# Patient Record
Sex: Male | Born: 1995 | Race: Black or African American | Hispanic: No | Marital: Single | Smoking: Never smoker
Health system: Southern US, Community
[De-identification: ages and names within clinical notes are randomized; demographics above are authoritative.]

## PROBLEM LIST (undated history)

## (undated) DIAGNOSIS — M94261 Chondromalacia, right knee: Secondary | ICD-10-CM

## (undated) DIAGNOSIS — Z87898 Personal history of other specified conditions: Secondary | ICD-10-CM

## (undated) DIAGNOSIS — M2340 Loose body in knee, unspecified knee: Secondary | ICD-10-CM

---
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
## 2016-11-12 DIAGNOSIS — J069 Acute upper respiratory infection, unspecified: Secondary | ICD-10-CM | POA: Diagnosis not present

## 2016-11-14 ENCOUNTER — Ambulatory Visit (HOSPITAL_COMMUNITY)
Admission: EM | Admit: 2016-11-14 | Discharge: 2016-11-14 | Disposition: A | Discharge status: Home / Self Care | Payer: Commercial Managed Care - HMO | Attending: Family Medicine | Admitting: Family Medicine

## 2016-11-14 ENCOUNTER — Encounter (HOSPITAL_COMMUNITY): Payer: Self-pay | Admitting: Emergency Medicine

## 2016-11-14 DIAGNOSIS — J069 Acute upper respiratory infection, unspecified: Secondary | ICD-10-CM

## 2016-11-14 DIAGNOSIS — B9789 Other viral agents as the cause of diseases classified elsewhere: Secondary | ICD-10-CM

## 2016-11-14 MED ORDER — HYDROCODONE-HOMATROPINE 5-1.5 MG/5ML PO SYRP: 5.0000 mL | ORAL_SOLUTION | Freq: Four times a day (QID) | ORAL | 0 refills | Status: DC | PRN

## 2016-11-14 NOTE — ED Provider Notes (Addendum)
[facility]    CSN: [id number] Arrival date & time: 11/14/16  1650     History   Chief Complaint Chief Complaint  Patient presents with  . Cough    HPI Bryan Roach is a 21 y.o. male presenting for cough. He's been recovering from a recent URI diagnosed as a sinus infection by the student health clinic about 1 week ago. He presented there with a 5 day history of yellow sinus drainage, headache/sinus pressure and fever. These symptoms have subsided but he's developed a severe, frequent intermittent nonproductive cough worsening over the past week. He has not slept due to cough, despite taking several OTC cough syrup/medications. He has trouble breathing during coughing spells but not otherwise. He denies a history of asthma. He had pneumonia last year and states this is nothing like that. He's not measured a fever at home. Nonsmoker, denies EtOH or other drugs, student at [employer].   HPI  Past Medical History:  Diagnosis Date  . 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 History of febrile seizure    x 2-3  . Loose body in knee 08/2014   right    Patient Active Problem List   Diagnosis Date Noted  . Seasonal allergies   . ACL tear   . Lateral meniscus tear 07/03/2013  . Sports physical 04/17/2013    Past Surgical History:  Procedure Laterality Date  . CHONDROPLASTY  09/01/2014   Procedure: CHONDROPLASTY;  Surgeon: Nilda Simmer, MD;  Location: [facility];  Service: Orthopedics;;  . KNEE ARTHROSCOPY WITH ANTERIOR CRUCIATE LIGAMENT (ACL) REPAIR WITH HAMSTRING GRAFT Right 07/21/2013   Procedure: KNEE ARTHROSCOPY WITH ANTERIOR CRUCIATE LIGAMENT (ACL) REPAIR WITH HAMSTRING GRAFT;  Surgeon: Nilda Simmer, MD;  Location: [facility];  Service: Orthopedics;  Laterality: Right;  . KNEE ARTHROSCOPY WITH LATERAL MENISECTOMY Right 07/21/2013   Procedure: RIGHT KNEE ARTHROSCOPY WITH LATERAL MENISECTOMY VS. LATERAL MENISCUS REPAIR;   Surgeon: Nilda Simmer, MD;  Location: [facility];  Service: Orthopedics;  Laterality: Right;  . KNEE ARTHROSCOPY WITH LATERAL MENISECTOMY  09/01/2014   Procedure: KNEE ARTHROSCOPY WITH LATERAL MENISECTOMY;  Surgeon: Nilda Simmer, MD;  Location: [facility];  Service: Orthopedics;;  . KNEE ARTHROSCOPY WITH MEDIAL MENISECTOMY Right 09/01/2014   Procedure: RIGHT KNEE SCOPE WITH LOOSE BODY REMOVAL, PARTIAL MEDIAL AND LATERAL MENISECTOMIES, CHONDROPLASTY ;  Surgeon: Nilda Simmer, MD;  Location: [facility];  Service: Orthopedics;  Laterality: Right;       Home Medications    Prior to Admission medications   Medication Sig Start Date End Date Taking? Authorizing Provider  HYDROcodone-homatropine (HYCODAN) 5-1.5 MG/5ML syrup Take 5 mLs by mouth every 6 (six) hours as needed for cough. 11/14/16   Tyrone Nine, MD    Family History Family History  Problem Relation Age of Onset  . Hypertension Maternal Grandmother   . Hypertension Mother   . Hypertension Maternal Aunt   . Sudden death Neg Hx   . Heart attack Neg Hx     Social History Social History  Substance Use Topics  . Smoking status: Never Smoker  . Smokeless tobacco: Never Used  . Alcohol use No     Allergies   Apple; Banana; Peach flavor; and Penicillins   Review of Systems Review of Systems Per HPI and no myalgias, arthralgias or chest pain.  Physical Exam Physical Exam Blood pressure 121/82, pulse 88, temperature 99 F (37.2 C), temperature source  Oral, resp. rate 18, SpO2 96 %. GEN: Athletic adolescent male in no distress HEENT: MMM, conjunctivae normal, TMs normal bilaterally, PND noticed in otherwise clear oropharynx, nares patent with normal turbinates.   NECK: supple, no lymphadenopathy. CHEST: normal air exchange with normal respiratory effort and no retractions; no rales, no rhonchi, no wheezes HEART: regular rate, normal S1/S2, no murmurs  UC Treatments /  Results  Procedures (including critical care time)  Initial Impression / Assessment and Plan / UC Course  I have reviewed the triage vital signs and the nursing notes.  Pertinent labs & imaging results that were available during my care of the patient were reviewed by me and considered in my medical decision making (see chart for details).    21 y.o. male with post URI cough without red flags. No indication for CXR or labs at this time. Will treat symptomatically with prescription for cough syrup in addition to honey, hydration. Return if not improving in next week.   Final Clinical Impressions(s) / UC Diagnoses   Final diagnoses:  Viral URI with cough    New Prescriptions New Prescriptions   HYDROCODONE-HOMATROPINE (HYCODAN) 5-1.5 MG/5ML SYRUP    Take 5 mLs by mouth every 6 (six) hours as needed for cough.     Tyrone Nineyan B Lakeyia Surber, MD 11/14/16 1815    Tyrone Nineyan B Eliza Grissinger, MD 11/14/16 [phone]

## 2016-11-14 NOTE — Discharge Instructions (Signed)
You're experiencing a cough that is the result of the upper respiratory infection you had last week. This will continue to improve over time. Take the cough syrup at night as needed. Take a tablespoon of honey as needed and keep lozenges on hand to help with cough during the day. If you develop fever, productive worsening cough please return for care.

## 2016-11-14 NOTE — ED Triage Notes (Signed)
See s/s.  Patient reports cough is worse over the last few days.  Cough is disrupting sleep.

## 2017-03-13 ENCOUNTER — Encounter (HOSPITAL_BASED_OUTPATIENT_CLINIC_OR_DEPARTMENT_OTHER): Payer: Self-pay | Admitting: *Deleted

## 2017-03-13 ENCOUNTER — Emergency Department (HOSPITAL_BASED_OUTPATIENT_CLINIC_OR_DEPARTMENT_OTHER)
Admission: EM | Admit: 2017-03-13 | Discharge: 2017-03-13 | Disposition: A | Discharge status: Home / Self Care | Payer: 59 | Attending: Emergency Medicine | Admitting: Emergency Medicine

## 2017-03-13 DIAGNOSIS — M542 Cervicalgia: Secondary | ICD-10-CM | POA: Diagnosis not present

## 2017-03-13 NOTE — Discharge Instructions (Signed)
Please read instructions below. You can take advil every 6 hours as needed for pain. Talk with your PCP about any new medications. Follow up as needed if symptoms persist. Return to ER if new numbness or tingling in your arms or legs, inability to urinate, inability to hold your bowels, or weakness in your extremities, severe headache, changes in your vision, or uncontrollable vomiting.
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
## 2017-03-13 NOTE — ED Notes (Signed)
Pt verbalizes understanding of d/c instructions and denies any further needs at this time. 
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
## 2017-04-01 ENCOUNTER — Encounter (HOSPITAL_BASED_OUTPATIENT_CLINIC_OR_DEPARTMENT_OTHER): Payer: Self-pay | Admitting: *Deleted

## 2017-04-01 ENCOUNTER — Emergency Department (HOSPITAL_BASED_OUTPATIENT_CLINIC_OR_DEPARTMENT_OTHER)
Admission: EM | Admit: 2017-04-01 | Discharge: 2017-04-01 | Disposition: A | Discharge status: Home / Self Care | Payer: 59 | Attending: Emergency Medicine | Admitting: Emergency Medicine

## 2017-04-01 DIAGNOSIS — G44319 Acute post-traumatic headache, not intractable: Secondary | ICD-10-CM | POA: Diagnosis not present

## 2017-04-01 DIAGNOSIS — Y939 Activity, unspecified: Secondary | ICD-10-CM | POA: Insufficient documentation

## 2017-04-01 DIAGNOSIS — S0990XA Unspecified injury of head, initial encounter: Secondary | ICD-10-CM | POA: Diagnosis not present

## 2017-04-01 DIAGNOSIS — Y999 Unspecified external cause status: Secondary | ICD-10-CM | POA: Diagnosis not present

## 2017-04-01 DIAGNOSIS — G44309 Post-traumatic headache, unspecified, not intractable: Secondary | ICD-10-CM

## 2017-04-01 DIAGNOSIS — Y929 Unspecified place or not applicable: Secondary | ICD-10-CM | POA: Diagnosis not present
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
## 2017-09-24 DIAGNOSIS — M25512 Pain in left shoulder: Secondary | ICD-10-CM | POA: Diagnosis not present

## 2017-09-25 ENCOUNTER — Other Ambulatory Visit: Payer: Self-pay | Admitting: Orthopedic Surgery

## 2017-09-25 DIAGNOSIS — M25512 Pain in left shoulder: Secondary | ICD-10-CM

## 2017-10-10 ENCOUNTER — Inpatient Hospital Stay
Admission: RE | Admit: 2017-10-10 | Discharge: 2017-10-10 | Disposition: A | Discharge status: Home / Self Care | Payer: Commercial Managed Care - HMO | Source: Ambulatory Visit | Attending: Orthopedic Surgery | Admitting: Orthopedic Surgery

## 2018-12-08 ENCOUNTER — Ambulatory Visit (HOSPITAL_COMMUNITY)
Admission: EM | Admit: 2018-12-08 | Discharge: 2018-12-08 | Disposition: A | Discharge status: Home / Self Care | Payer: 59 | Attending: Family Medicine | Admitting: Family Medicine

## 2018-12-08 ENCOUNTER — Other Ambulatory Visit: Payer: Self-pay

## 2018-12-08 ENCOUNTER — Encounter (HOSPITAL_COMMUNITY): Payer: Self-pay

## 2018-12-08 DIAGNOSIS — Z113 Encounter for screening for infections with a predominantly sexual mode of transmission: Secondary | ICD-10-CM | POA: Insufficient documentation

## 2018-12-08 NOTE — ED Triage Notes (Signed)
Patient presents to Urgent Care with requests to be tested for STDs. Patient states he has no symptoms and was not notified to come get tested, just is here for reassurance.

## 2018-12-08 NOTE — ED Provider Notes (Signed)
[facility]    CSN: [id number] Arrival date & time: 12/08/18  1653     History   Chief Complaint Chief Complaint  Patient presents with  . Exposure to STD    HPI Bryan Roach is a 23 y.o. male.   23 year old male comes in for STD testing. No symptoms or exposures. States, "just want to be tested". He denies penile discharge, penile lesion/sores, testicular swelling/pain. Denies dysuria, hematuria, frequency. Denies abdominal pain, nausea, vomiting. Denies fever, chills, night sweats. Sexually active with one male partner, occasional condom use.      Past Medical History:  Diagnosis Date  . 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 History of febrile seizure    x 2-3  . Loose body in knee 08/2014   right    Patient Active Problem List   Diagnosis Date Noted  . Seasonal allergies   . ACL tear   . Lateral meniscus tear 07/03/2013  . Sports physical 04/17/2013    Past Surgical History:  Procedure Laterality Date  . CHONDROPLASTY  09/01/2014   Procedure: CHONDROPLASTY;  Surgeon: Nilda Simmer, MD;  Location: [facility];  Service: Orthopedics;;  . KNEE ARTHROSCOPY WITH ANTERIOR CRUCIATE LIGAMENT (ACL) REPAIR WITH HAMSTRING GRAFT Right 07/21/2013   Procedure: KNEE ARTHROSCOPY WITH ANTERIOR CRUCIATE LIGAMENT (ACL) REPAIR WITH HAMSTRING GRAFT;  Surgeon: Nilda Simmer, MD;  Location: [facility];  Service: Orthopedics;  Laterality: Right;  . KNEE ARTHROSCOPY WITH LATERAL MENISECTOMY Right 07/21/2013   Procedure: RIGHT KNEE ARTHROSCOPY WITH LATERAL MENISECTOMY VS. LATERAL MENISCUS REPAIR;  Surgeon: Nilda Simmer, MD;  Location: [facility];  Service: Orthopedics;  Laterality: Right;  . KNEE ARTHROSCOPY WITH LATERAL MENISECTOMY  09/01/2014   Procedure: KNEE ARTHROSCOPY WITH LATERAL MENISECTOMY;  Surgeon: Nilda Simmer, MD;  Location: [facility];  Service: Orthopedics;;  . KNEE ARTHROSCOPY  WITH MEDIAL MENISECTOMY Right 09/01/2014   Procedure: RIGHT KNEE SCOPE WITH LOOSE BODY REMOVAL, PARTIAL MEDIAL AND LATERAL MENISECTOMIES, CHONDROPLASTY ;  Surgeon: Nilda Simmer, MD;  Location: [facility];  Service: Orthopedics;  Laterality: Right;       Home Medications    Prior to Admission medications   Not on File    Family History Family History  Problem Relation Age of Onset  . Hypertension Maternal Grandmother   . Hypertension Mother   . Hypertension Maternal Aunt   . Sudden death Neg Hx   . Heart attack Neg Hx     Social History Social History   Tobacco Use  . Smoking status: Never Smoker  . Smokeless tobacco: Never Used  Substance Use Topics  . Alcohol use: No  . Drug use: No     Allergies   Apple; Banana; Peach flavor; and Penicillins   Review of Systems Review of Systems  Reason unable to perform ROS: See HPI as above.     Physical Exam Triage Vital Signs ED Triage Vitals  Enc Vitals Group     BP 12/08/18 1732 140/71     Pulse Rate 12/08/18 1732 88     Resp 12/08/18 1732 16     Temp 12/08/18 1732 98.4 F (36.9 C)     Temp Source 12/08/18 1732 Oral     SpO2 12/08/18 1732 99 %     Weight 12/08/18 1731 260 lb (117.9 kg)     Height 12/08/18 1731 5\' 11"  (1.803 m)     Head Circumference --  Peak Flow --      Pain Score 12/08/18 1731 0     Pain Loc --      Pain Edu? --      Excl. in GC? --    No data found.  Updated Vital Signs BP 140/71 (BP Location: Right Arm)   Pulse 88   Temp 98.4 F (36.9 C) (Oral)   Resp 16   Ht 5\' 11"  (1.803 m)   Wt 260 lb (117.9 kg)   SpO2 99%   BMI 36.26 kg/m   Physical Exam Constitutional:      General: He is not in acute distress.    Appearance: He is well-developed. He is not diaphoretic.  HENT:     Head: Normocephalic and atraumatic.  Eyes:     Conjunctiva/sclera: Conjunctivae normal.     Pupils: Pupils are equal, round, and reactive to light.  Pulmonary:     Effort:  Pulmonary effort is normal. No respiratory distress.  Neurological:     Mental Status: He is alert and oriented to person, place, and time.      UC Treatments / Results  Labs (all labs ordered are listed, but only abnormal results are displayed) Labs Reviewed - No data to display  EKG None  Radiology No results found.  Procedures Procedures (including critical care time)  Medications Ordered in UC Medications - No data to display  Initial Impression / Assessment and Plan / UC Course  I have reviewed the triage vital signs and the nursing notes.  Pertinent labs & imaging results that were available during my care of the patient were reviewed by me and considered in my medical decision making (see chart for details).    Cytology sent, patient will be contacted with any positive results that require additional treatment. Patient to refrain from sexual activity for the next 7 days. Return precautions given.   Final Clinical Impressions(s) / UC Diagnoses   Final diagnoses:  Screen for STD (sexually transmitted disease)    ED Prescriptions    None        Belinda Fisher, PA-C 12/08/18 1810

## 2018-12-08 NOTE — Discharge Instructions (Signed)
Cytology sent, you will be contacted with any positive results that requires further treatment. Refrain from sexual activity for the next 7 days. Monitor for testicular swelling/pain, penile sore/lesion, follow up for reevaluation needed.

## 2018-12-09 LAB — URINE CYTOLOGY ANCILLARY ONLY
Chlamydia: NEGATIVE
Neisseria Gonorrhea: NEGATIVE
Trichomonas: NEGATIVE

## 2019-10-29 ENCOUNTER — Other Ambulatory Visit: Payer: Self-pay

## 2019-10-29 ENCOUNTER — Emergency Department (HOSPITAL_COMMUNITY): Payer: 59

## 2019-10-29 ENCOUNTER — Encounter (HOSPITAL_COMMUNITY): Payer: Self-pay | Admitting: Emergency Medicine

## 2019-10-29 ENCOUNTER — Emergency Department (HOSPITAL_COMMUNITY)
Admission: EM | Admit: 2019-10-29 | Discharge: 2019-10-29 | Disposition: A | Discharge status: Home / Self Care | Payer: 59 | Attending: Emergency Medicine | Admitting: Emergency Medicine

## 2019-10-29 DIAGNOSIS — Z23 Encounter for immunization: Secondary | ICD-10-CM | POA: Diagnosis not present

## 2019-10-29 DIAGNOSIS — J90 Pleural effusion, not elsewhere classified: Secondary | ICD-10-CM | POA: Diagnosis not present

## 2019-10-29 DIAGNOSIS — Y92411 Interstate highway as the place of occurrence of the external cause: Secondary | ICD-10-CM | POA: Diagnosis not present

## 2019-10-29 DIAGNOSIS — Y999 Unspecified external cause status: Secondary | ICD-10-CM | POA: Insufficient documentation

## 2019-10-29 DIAGNOSIS — R52 Pain, unspecified: Secondary | ICD-10-CM

## 2019-10-29 DIAGNOSIS — Y93I9 Activity, other involving external motion: Secondary | ICD-10-CM | POA: Insufficient documentation

## 2019-10-29 DIAGNOSIS — S60511A Abrasion of right hand, initial encounter: Secondary | ICD-10-CM | POA: Diagnosis not present

## 2019-10-29 DIAGNOSIS — M25511 Pain in right shoulder: Secondary | ICD-10-CM | POA: Diagnosis present

## 2019-10-29 LAB — CBC
HCT: 47.7 % (ref 39.0–52.0)
Hemoglobin: 15.2 g/dL (ref 13.0–17.0)
MCH: 27.4 pg (ref 26.0–34.0)
MCHC: 31.9 g/dL (ref 30.0–36.0)
MCV: 85.9 fL (ref 80.0–100.0)
Platelets: 303 10*3/uL (ref 150–400)
RBC: 5.55 MIL/uL (ref 4.22–5.81)
RDW: 13.6 % (ref 11.5–15.5)
WBC: 11.8 10*3/uL — ABNORMAL HIGH (ref 4.0–10.5)
nRBC: 0 % (ref 0.0–0.2)

## 2019-10-29 LAB — COMPREHENSIVE METABOLIC PANEL
ALT: 24 U/L (ref 0–44)
AST: 26 U/L (ref 15–41)
Albumin: 4.2 g/dL (ref 3.5–5.0)
Alkaline Phosphatase: 66 U/L (ref 38–126)
Anion gap: 11 (ref 5–15)
BUN: 15 mg/dL (ref 6–20)
CO2: 25 mmol/L (ref 22–32)
Calcium: 9.6 mg/dL (ref 8.9–10.3)
Chloride: 103 mmol/L (ref 98–111)
Creatinine, Ser: 1.31 mg/dL — ABNORMAL HIGH (ref 0.61–1.24)
GFR calc Af Amer: >60 mL/min (ref >60)
GFR calc non Af Amer: >60 mL/min (ref >60)
Glucose, Bld: 100 mg/dL — ABNORMAL HIGH (ref 70–99)
Potassium: 4.7 mmol/L (ref 3.5–5.1)
Sodium: 139 mmol/L (ref 135–145)
Total Bilirubin: 0.8 mg/dL (ref 0.3–1.2)
Total Protein: 7.7 g/dL (ref 6.5–8.1)

## 2019-10-29 LAB — PROTIME-INR
INR: 0.9 (ref 0.8–1.2)
Prothrombin Time: 12.5 seconds (ref 11.4–15.2)

## 2019-10-29 LAB — I-STAT CHEM 8, ED
BUN: 16 mg/dL (ref 6–20)
Calcium, Ion: 1.21 mmol/L (ref 1.15–1.40)
Chloride: 102 mmol/L (ref 98–111)
Creatinine, Ser: 1.3 mg/dL — ABNORMAL HIGH (ref 0.61–1.24)
Glucose, Bld: 97 mg/dL (ref 70–99)
HCT: 49 % (ref 39.0–52.0)
Hemoglobin: 16.7 g/dL (ref 13.0–17.0)
Potassium: 4.7 mmol/L (ref 3.5–5.1)
Sodium: 139 mmol/L (ref 135–145)
TCO2: 28 mmol/L (ref 22–32)

## 2019-10-29 LAB — ETHANOL: Alcohol, Ethyl (B): <10 mg/dL (ref <10)

## 2019-10-29 LAB — SAMPLE TO BLOOD BANK

## 2019-10-29 LAB — LACTIC ACID, PLASMA: Lactic Acid, Venous: 2 mmol/L (ref 0.5–1.9)

## 2019-10-29 MED ORDER — IOHEXOL 300 MG/ML  SOLN
100.0000 mL | Freq: Once | INTRAMUSCULAR | Status: AC | PRN
  Administered 2019-10-29: 19:00:00 100 mL via INTRAVENOUS

## 2019-10-29 MED ORDER — TETANUS-DIPHTH-ACELL PERTUSSIS 5-2.5-18.5 LF-MCG/0.5 IM SUSP
0.5000 mL | Freq: Once | INTRAMUSCULAR | Status: AC
  Administered 2019-10-29: 17:00:00 0.5 mL via INTRAMUSCULAR
  Filled 2019-10-29: qty 0.5

## 2019-10-29 MED ORDER — NAPROXEN 375 MG PO TABS: 375.0000 mg | ORAL_TABLET | Freq: Two times a day (BID) | ORAL | 0 refills | Status: AC

## 2019-10-29 MED ORDER — CYCLOBENZAPRINE HCL 10 MG PO TABS: 10.0000 mg | ORAL_TABLET | Freq: Two times a day (BID) | ORAL | 0 refills | Status: AC | PRN
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
## 2019-10-29 NOTE — Discharge Instructions (Addendum)
We discussed the results of your CT.  I have prescribed a short course of anti-inflammatories along with muscle relaxers to help with your symptoms.  Please be aware you will most likely be very sore tomorrow, you may also apply heat or ice to the area to help with your symptoms.  You experience any chest pain, shortness of breath, worsening symptoms please return to emergency department.
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
## 2019-12-11 ENCOUNTER — Ambulatory Visit: Payer: 59 | Attending: Internal Medicine

## 2019-12-11 DIAGNOSIS — Z23 Encounter for immunization: Secondary | ICD-10-CM

## 2019-12-11 NOTE — Progress Notes (Signed)
   Covid-19 Vaccination Clinic  Name:  Bryan Roach    MRN: [id number] DOB: 06-May-1996  12/11/2019  Bryan Roach was observed post Covid-19 immunization for 15 minutes without incident. He was provided with Vaccine Information Sheet and instruction to access the V-Safe system.   Bryan Roach was instructed to call 911 with any severe reactions post vaccine: Marland Kitchen Difficulty breathing  . Swelling of face and throat  . A fast heartbeat  . A bad rash all over body  . Dizziness and weakness   Immunizations Administered    Name Date Dose VIS Date Route   Pfizer COVID-19 Vaccine 12/11/2019 11:14 AM 0.3 mL 08/28/2019 Intramuscular   Manufacturer: [employer], [employer]   Lot: [id number]   NDC: [address]-1

## 2020-01-06 ENCOUNTER — Ambulatory Visit: Payer: 59 | Attending: Internal Medicine

## 2020-01-06 DIAGNOSIS — Z23 Encounter for immunization: Secondary | ICD-10-CM

## 2020-01-06 NOTE — Progress Notes (Signed)
   Covid-19 Vaccination Clinic  Name:  JAQUAN SADOWSKY    MRN: [id number] DOB: 25-Apr-1996  01/06/2020  Mr. Renovato was observed post Covid-19 immunization for 15 minutes without incident. He was provided with Vaccine Information Sheet and instruction to access the V-Safe system.   Mr. Heffley was instructed to call 911 with any severe reactions post vaccine: Marland Kitchen Difficulty breathing  . Swelling of face and throat  . A fast heartbeat  . A bad rash all over body  . Dizziness and weakness   Immunizations Administered    Name Date Dose VIS Date Route   Pfizer COVID-19 Vaccine 01/06/2020  1:59 PM 0.3 mL 11/11/2018 Intramuscular   Manufacturer: [employer], [employer]   Lot: [id number]   NDC: [address]-9
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
## 2021-11-03 ENCOUNTER — Encounter (HOSPITAL_COMMUNITY): Payer: Self-pay

## 2021-11-03 ENCOUNTER — Ambulatory Visit (HOSPITAL_COMMUNITY)
Admission: EM | Admit: 2021-11-03 | Discharge: 2021-11-03 | Disposition: A | Discharge status: Home / Self Care | Payer: 59 | Attending: Physician Assistant | Admitting: Physician Assistant

## 2021-11-03 DIAGNOSIS — J0120 Acute ethmoidal sinusitis, unspecified: Secondary | ICD-10-CM | POA: Diagnosis not present

## 2021-11-03 LAB — CBG MONITORING, ED: Glucose-Capillary: 78 mg/dL (ref 70–99)

## 2021-11-03 MED ORDER — DOXYCYCLINE HYCLATE 100 MG PO CAPS: 100.0000 mg | ORAL_CAPSULE | Freq: Two times a day (BID) | ORAL | 0 refills | Status: AC

## 2021-11-03 NOTE — Discharge Instructions (Signed)
Return if any problems.  See your Physician for recheck  °

## 2021-11-03 NOTE — ED Triage Notes (Signed)
Pt presents to the office for cough, nasal congestion and sob x 3 weeks.

## 2021-11-03 NOTE — ED Provider Notes (Signed)
[facility]    CSN: [id number] Arrival date & time: 11/03/21  1431      History   Chief Complaint Chief Complaint  Patient presents with   Cough    Congestion     HPI Bryan Roach is a 26 y.o. male.   Pt reports he has sinus congestion and discomfort for several weeks.   The history is provided by the patient. No language interpreter was used.  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   History of febrile seizure    x 2-3   Loose body in knee 08/2014   right    Patient Active Problem List   Diagnosis Date Noted   Seasonal allergies    ACL tear    Lateral meniscus tear 07/03/2013   Sports physical 04/17/2013    Past Surgical History:  Procedure Laterality Date   CHONDROPLASTY  09/01/2014   Procedure: CHONDROPLASTY;  Surgeon: Nilda Simmer, MD;  Location: [facility];  Service: Orthopedics;;   KNEE ARTHROSCOPY WITH ANTERIOR CRUCIATE LIGAMENT (ACL) REPAIR WITH HAMSTRING GRAFT Right 07/21/2013   Procedure: KNEE ARTHROSCOPY WITH ANTERIOR CRUCIATE LIGAMENT (ACL) REPAIR WITH HAMSTRING GRAFT;  Surgeon: Nilda Simmer, MD;  Location: [facility];  Service: Orthopedics;  Laterality: Right;   KNEE ARTHROSCOPY WITH LATERAL MENISECTOMY Right 07/21/2013   Procedure: RIGHT KNEE ARTHROSCOPY WITH LATERAL MENISECTOMY VS. LATERAL MENISCUS REPAIR;  Surgeon: Nilda Simmer, MD;  Location: [facility];  Service: Orthopedics;  Laterality: Right;   KNEE ARTHROSCOPY WITH LATERAL MENISECTOMY  09/01/2014   Procedure: KNEE ARTHROSCOPY WITH LATERAL MENISECTOMY;  Surgeon: Nilda Simmer, MD;  Location: [facility];  Service: Orthopedics;;   KNEE ARTHROSCOPY WITH  MEDIAL MENISECTOMY Right 09/01/2014   Procedure: RIGHT KNEE SCOPE WITH LOOSE BODY REMOVAL, PARTIAL MEDIAL AND LATERAL MENISECTOMIES, CHONDROPLASTY ;  Surgeon: Nilda Simmer, MD;  Location: [facility];  Service: Orthopedics;  Laterality: Right;       Home Medications    Prior to Admission medications   Medication Sig Start Date End Date Taking? Authorizing Provider  doxycycline (VIBRAMYCIN) 100 MG capsule Take 1 capsule (100 mg total) by mouth 2 (two) times daily. 11/03/21  Yes Elson Areas, PA-C    Family History Family History  Problem Relation Age of Onset   Hypertension Maternal Grandmother    Hypertension Mother    Hypertension Maternal Aunt    Sudden death Neg Hx    Heart attack Neg Hx     Social History Social History   Tobacco Use   Smoking status: Never   Smokeless tobacco: Never  Vaping Use   Vaping Use: Never used  Substance Use Topics   Alcohol use: No   Drug use: No     Allergies   Apple juice, Banana, Peach flavor, and Penicillins   Review of Systems Review of Systems  Respiratory:  Positive for cough.   All other systems reviewed and are negative.   Physical Exam Triage Vital Signs ED Triage Vitals [11/03/21 1607]  Enc Vitals Group     BP (!) 149/100     Pulse Rate 95     Resp 18     Temp 98.7 F (37.1 C)  Temp Source Oral     SpO2 100 %     Weight      Height      Head Circumference      Peak Flow      Pain Score      Pain Loc      Pain Edu?      Excl. in GC?    No data found.  Updated Vital Signs BP (!) 149/100 (BP Location: Left Arm)    Pulse 95    Temp 98.7 F (37.1 C) (Oral)    Resp 18    SpO2 100%   Visual Acuity Right Eye Distance:   Left Eye Distance:   Bilateral Distance:    Right Eye Near:   Left Eye Near:    Bilateral Near:     Physical Exam Vitals and nursing note reviewed.  Constitutional:      Appearance: He is well-developed.  HENT:     Head: Normocephalic.     Right Ear:  Tympanic membrane normal.     Left Ear: Tympanic membrane normal.     Mouth/Throat:     Mouth: Mucous membranes are moist.  Eyes:     Pupils: Pupils are equal, round, and reactive to light.  Cardiovascular:     Rate and Rhythm: Normal rate.  Pulmonary:     Effort: Pulmonary effort is normal.  Abdominal:     General: There is no distension.  Musculoskeletal:        General: Normal range of motion.     Cervical back: Normal range of motion.  Neurological:     General: No focal deficit present.     Mental Status: He is alert and oriented to person, place, and time.  Psychiatric:        Mood and Affect: Mood normal.     UC Treatments / Results  Labs (all labs ordered are listed, but only abnormal results are displayed) Labs Reviewed  CBG MONITORING, ED    EKG   Radiology No results found.  Procedures Procedures (including critical care time)  Medications Ordered in UC Medications - No data to display  Initial Impression / Assessment and Plan / UC Course  I have reviewed the triage vital signs and the nursing notes.  Pertinent labs & imaging results that were available during my care of the patient were reviewed by me and considered in my medical decision making (see chart for details).     MDM:  Pt reports increased thirst.  Cbg is normal.  I suspect symptoms are from mouth breathing  Final Clinical Impressions(s) / UC Diagnoses   Final diagnoses:  Acute ethmoidal sinusitis, recurrence not specified     Discharge Instructions      Return if any problems.  See your Physician for recheck.     ED Prescriptions     Medication Sig Dispense Auth. Provider   doxycycline (VIBRAMYCIN) 100 MG capsule Take 1 capsule (100 mg total) by mouth 2 (two) times daily. 20 capsule Elson Areas, [place]      PDMP not reviewed this encounter. An After Visit Summary was printed and given to the patient.    Elson Areas, [place] 11/03/21 1650

## 2022-12-26 ENCOUNTER — Ambulatory Visit: Payer: 59 | Admitting: Emergency Medicine

## 2023-10-28 ENCOUNTER — Encounter (HOSPITAL_COMMUNITY): Payer: Self-pay

## 2023-10-28 ENCOUNTER — Ambulatory Visit (HOSPITAL_COMMUNITY)
Admission: EM | Admit: 2023-10-28 | Discharge: 2023-10-28 | Disposition: A | Discharge status: Home / Self Care | Payer: BC Managed Care – PPO | Attending: Internal Medicine | Admitting: Internal Medicine

## 2023-10-28 DIAGNOSIS — J069 Acute upper respiratory infection, unspecified: Secondary | ICD-10-CM | POA: Diagnosis not present

## 2023-10-28 LAB — POC COVID19/FLU A&B COMBO
Covid Antigen, POC: NEGATIVE
Influenza A Antigen, POC: NEGATIVE
Influenza B Antigen, POC: NEGATIVE

## 2023-10-28 MED ORDER — PREDNISONE 20 MG PO TABS: 40.0000 mg | ORAL_TABLET | Freq: Every day | ORAL | 0 refills | Status: AC

## 2023-10-28 MED ORDER — PROMETHAZINE-DM 6.25-15 MG/5ML PO SYRP: 5.0000 mL | ORAL_SOLUTION | Freq: Three times a day (TID) | ORAL | 0 refills | Status: AC | PRN

## 2023-10-28 NOTE — Discharge Instructions (Addendum)
 Flu A, flu B and COVID are negative.  Symptoms are still most consistent with a viral upper respiratory infection.  This does not require antibiotics.  These normally take 5 to 10 days to completely resolve.  We treat the symptoms.  We will treat with the following:  Promethazine  DM 5 mL every 8 hours as needed for cough.  Use caution as this medication can cause drowsiness. Prednisone  40 mg (2 tablets) once daily for 5 days. Take this in the morning.  This is a steroid to help with inflammation and pain.  Rest and stay hydrated, drink plenty of fluids even if you do not feel like eating solid foods Return to urgent care or PCP if symptoms worsen or fail to resolve.

## 2023-10-28 NOTE — ED Triage Notes (Signed)
 Headache, fever,sore throat.body aches. Onset: Last night.

## 2023-10-28 NOTE — ED Provider Notes (Signed)
 [facility]    CSN: [id number] Arrival date & time: 10/28/23  1716      History   Chief Complaint Chief Complaint  Patient presents with   Headache   Fatigue   Sore Throat   Chills    HPI Bryan Roach is a 28 y.o. male.   28 y.o. male who presents to urgent care with complaints of fever, sore throat, headache and bodyaches.  The symptoms started last night.  He reports that he is also having a cough.  The cough is much worse at night.  He reports that his girlfriend has been diagnosed with the flu.  He denies nausea, vomiting, abdominal pain, shortness of breath, chest pain.   Headache Associated symptoms: fever and sore throat   Associated symptoms: no abdominal pain, no back pain, no cough, no ear pain, no eye pain, no seizures and no vomiting   Sore Throat Associated symptoms include headaches. Pertinent negatives include no chest pain, no abdominal pain and no shortness of breath.    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   History of febrile seizure    x 2-3   Loose body in knee 08/2014   right    Patient Active Problem List   Diagnosis Date Noted   Seasonal allergies     Past Surgical History:  Procedure Laterality Date   CHONDROPLASTY  09/01/2014   Procedure: CHONDROPLASTY;  Surgeon: Genevie Kerns, MD;  Location: [facility];  Service: Orthopedics;;   KNEE ARTHROSCOPY WITH ANTERIOR CRUCIATE LIGAMENT (ACL) REPAIR WITH HAMSTRING GRAFT Right 07/21/2013   Procedure: KNEE ARTHROSCOPY WITH ANTERIOR CRUCIATE LIGAMENT (ACL) REPAIR WITH HAMSTRING GRAFT;  Surgeon: Genevie Kerns, MD;  Location: [facility];  Service: Orthopedics;  Laterality: Right;   KNEE ARTHROSCOPY WITH LATERAL MENISECTOMY Right 07/21/2013   Procedure: RIGHT KNEE ARTHROSCOPY WITH LATERAL MENISECTOMY VS. LATERAL MENISCUS REPAIR;  Surgeon: Genevie Kerns, MD;  Location: [facility];  Service:  Orthopedics;  Laterality: Right;   KNEE ARTHROSCOPY WITH LATERAL MENISECTOMY  09/01/2014   Procedure: KNEE ARTHROSCOPY WITH LATERAL MENISECTOMY;  Surgeon: Genevie Kerns, MD;  Location: [facility];  Service: Orthopedics;;   KNEE ARTHROSCOPY WITH MEDIAL MENISECTOMY Right 09/01/2014   Procedure: RIGHT KNEE SCOPE WITH LOOSE BODY REMOVAL, PARTIAL MEDIAL AND LATERAL MENISECTOMIES, CHONDROPLASTY ;  Surgeon: Genevie Kerns, MD;  [facility];  Service: Orthopedics;  Laterality: Right;       Home Medications    Prior to Admission medications   Medication Sig Start Date End Date Taking? Authorizing Provider  doxycycline  (VIBRAMYCIN ) 100 MG capsule Take 1 capsule (100 mg total) by mouth 2 (two) times daily. 11/03/21   Sandi Crosby, PA-C    Family History Family History  Problem Relation Age of Onset   Hypertension Maternal Grandmother    Hypertension Mother    Hypertension Maternal Aunt    Sudden death Neg Hx    Heart attack Neg Hx     Social History Social History   Tobacco Use   Smoking status: Never   Smokeless tobacco: Never  Vaping Use   Vaping status: Never Used  Substance Use Topics   Alcohol use: No   Drug use: No     Allergies   Apple juice, Banana, Peach flavoring agent (non-screening), and Penicillins   Review of Systems Review of Systems  Constitutional:  Positive for fever. Negative for  chills.  HENT:  Positive for sore throat. Negative for ear pain.   Eyes:  Negative for pain and visual disturbance.  Respiratory:  Negative for cough and shortness of breath.   Cardiovascular:  Negative for chest pain and palpitations.  Gastrointestinal:  Negative for abdominal pain and vomiting.  Genitourinary:  Negative for dysuria and hematuria.  Musculoskeletal:  Negative for arthralgias and back pain.       Generalized body aches  Skin:  Negative for color change and rash.  Neurological:  Positive for headaches. Negative for  seizures and syncope.  All other systems reviewed and are negative.    Physical Exam Triage Vital Signs ED Triage Vitals [10/28/23 1847]  Encounter Vitals Group     BP (!) 166/97     Systolic BP Percentile      Diastolic BP Percentile      Pulse Rate (!) 104     Resp 16     Temp 98.3 F (36.8 C)     Temp Source Oral     SpO2 94 %     Weight      Height      Head Circumference      Peak Flow      Pain Score      Pain Loc      Pain Education      Exclude from Growth Chart    No data found.  Updated Vital Signs BP (!) 166/97 (BP Location: Left Arm)   Pulse (!) 104   Temp 98.3 F (36.8 C) (Oral)   Resp 16   SpO2 94%   Visual Acuity Right Eye Distance:   Left Eye Distance:   Bilateral Distance:    Right Eye Near:   Left Eye Near:    Bilateral Near:     Physical Exam Vitals and nursing note reviewed.  Constitutional:      General: He is not in acute distress.    Appearance: He is well-developed.  HENT:     Head: Normocephalic and atraumatic.  Eyes:     Conjunctiva/sclera: Conjunctivae normal.  Cardiovascular:     Rate and Rhythm: Normal rate and regular rhythm.     Heart sounds: No murmur heard. Pulmonary:     Effort: Pulmonary effort is normal. No respiratory distress.     Breath sounds: Normal breath sounds. No decreased breath sounds or wheezing.  Abdominal:     Palpations: Abdomen is soft.     Tenderness: There is no abdominal tenderness.  Musculoskeletal:        General: No swelling.     Cervical back: Neck supple.  Skin:    General: Skin is warm and dry.     Capillary Refill: Capillary refill takes less than 2 seconds.  Neurological:     Mental Status: He is alert.  Psychiatric:        Mood and Affect: Mood normal.      UC Treatments / Results  Labs (all labs ordered are listed, but only abnormal results are displayed) Labs Reviewed - No data to display  EKG   Radiology No results found.  Procedures Procedures (including  critical care time)  Medications Ordered in UC Medications - No data to display  Initial Impression / Assessment and Plan / UC Course  I have reviewed the triage vital signs and the nursing notes.  Pertinent labs & imaging results that were available during my care of the patient were reviewed by me and considered in my  medical decision making (see chart for details).     Viral upper respiratory tract infection with cough   Flu A, flu B and COVID are negative.  Symptoms are still most consistent with a viral upper respiratory infection.  This does not require antibiotics.  These normally take 5 to 10 days to completely resolve.  We treat the symptoms.  We will treat with the following:  Promethazine  DM 5 mL every 8 hours as needed for cough.  Use caution as this medication can cause drowsiness. Prednisone  40 mg (2 tablets) once daily for 5 days. Take this in the morning.  This is a steroid to help with inflammation and pain.  Rest and stay hydrated, drink plenty of fluids even if you do not feel like eating solid foods Return to urgent care or PCP if symptoms worsen or fail to resolve.    Final Clinical Impressions(s) / UC Diagnoses   Final diagnoses:  None   Discharge Instructions   None    ED Prescriptions   None    PDMP not reviewed this encounter.   Kreg Pesa, [place] 10/28/23 1911
